# Patient Record
Sex: Female | Born: 1950 | Race: Black or African American | Marital: Married | Smoking: Never smoker
Health system: Southern US, Community
[De-identification: ages and names within clinical notes are randomized; demographics above are authoritative.]

## PROBLEM LIST (undated history)

## (undated) DIAGNOSIS — E785 Hyperlipidemia, unspecified: Secondary | ICD-10-CM

## (undated) HISTORY — DX: Hyperlipidemia, unspecified: E78.5

## (undated) HISTORY — PX: ABDOMINAL HYSTERECTOMY: SHX81

---

## 1998-12-07 ENCOUNTER — Other Ambulatory Visit: Admission: RE | Admit: 1998-12-07 | Discharge: 1998-12-07 | Payer: Self-pay | Admitting: Internal Medicine

## 1999-12-13 ENCOUNTER — Other Ambulatory Visit: Admission: RE | Admit: 1999-12-13 | Discharge: 1999-12-13 | Payer: Self-pay | Admitting: Internal Medicine

## 2001-01-22 ENCOUNTER — Other Ambulatory Visit: Admission: RE | Admit: 2001-01-22 | Discharge: 2001-01-22 | Payer: Self-pay | Admitting: Internal Medicine

## 2001-03-05 ENCOUNTER — Ambulatory Visit (HOSPITAL_COMMUNITY): Admission: RE | Admit: 2001-03-05 | Discharge: 2001-03-05 | Payer: Self-pay | Admitting: Internal Medicine

## 2001-03-05 ENCOUNTER — Encounter: Payer: Self-pay | Admitting: Internal Medicine

## 2002-07-15 ENCOUNTER — Ambulatory Visit (HOSPITAL_COMMUNITY): Admission: RE | Admit: 2002-07-15 | Discharge: 2002-07-15 | Payer: Self-pay | Admitting: Gastroenterology

## 2015-07-29 DIAGNOSIS — R7309 Other abnormal glucose: Secondary | ICD-10-CM | POA: Diagnosis not present

## 2015-07-29 DIAGNOSIS — E669 Obesity, unspecified: Secondary | ICD-10-CM | POA: Diagnosis not present

## 2015-07-29 DIAGNOSIS — E785 Hyperlipidemia, unspecified: Secondary | ICD-10-CM | POA: Diagnosis not present

## 2015-07-29 DIAGNOSIS — Z0000 Encounter for general adult medical examination without abnormal findings: Secondary | ICD-10-CM | POA: Diagnosis not present

## 2015-09-30 DIAGNOSIS — Z1231 Encounter for screening mammogram for malignant neoplasm of breast: Secondary | ICD-10-CM | POA: Diagnosis not present

## 2015-09-30 DIAGNOSIS — Z803 Family history of malignant neoplasm of breast: Secondary | ICD-10-CM | POA: Diagnosis not present

## 2016-07-31 DIAGNOSIS — E669 Obesity, unspecified: Secondary | ICD-10-CM | POA: Diagnosis not present

## 2016-07-31 DIAGNOSIS — Z6835 Body mass index (BMI) 35.0-35.9, adult: Secondary | ICD-10-CM | POA: Diagnosis not present

## 2016-07-31 DIAGNOSIS — Z1231 Encounter for screening mammogram for malignant neoplasm of breast: Secondary | ICD-10-CM | POA: Diagnosis not present

## 2016-07-31 DIAGNOSIS — E785 Hyperlipidemia, unspecified: Secondary | ICD-10-CM | POA: Diagnosis not present

## 2016-11-13 DIAGNOSIS — Z1231 Encounter for screening mammogram for malignant neoplasm of breast: Secondary | ICD-10-CM | POA: Diagnosis not present

## 2016-11-13 DIAGNOSIS — Z803 Family history of malignant neoplasm of breast: Secondary | ICD-10-CM | POA: Diagnosis not present

## 2017-07-31 DIAGNOSIS — Z23 Encounter for immunization: Secondary | ICD-10-CM | POA: Diagnosis not present

## 2017-07-31 DIAGNOSIS — R7309 Other abnormal glucose: Secondary | ICD-10-CM | POA: Diagnosis not present

## 2017-07-31 DIAGNOSIS — E785 Hyperlipidemia, unspecified: Secondary | ICD-10-CM | POA: Diagnosis not present

## 2017-08-02 DIAGNOSIS — Z0000 Encounter for general adult medical examination without abnormal findings: Secondary | ICD-10-CM | POA: Diagnosis not present

## 2017-08-02 DIAGNOSIS — E2839 Other primary ovarian failure: Secondary | ICD-10-CM | POA: Diagnosis not present

## 2017-08-02 DIAGNOSIS — E669 Obesity, unspecified: Secondary | ICD-10-CM | POA: Diagnosis not present

## 2017-08-02 DIAGNOSIS — Z1231 Encounter for screening mammogram for malignant neoplasm of breast: Secondary | ICD-10-CM | POA: Diagnosis not present

## 2017-08-21 DIAGNOSIS — N390 Urinary tract infection, site not specified: Secondary | ICD-10-CM | POA: Diagnosis not present

## 2017-08-21 DIAGNOSIS — B354 Tinea corporis: Secondary | ICD-10-CM | POA: Diagnosis not present

## 2017-08-21 DIAGNOSIS — R350 Frequency of micturition: Secondary | ICD-10-CM | POA: Diagnosis not present

## 2017-10-15 ENCOUNTER — Other Ambulatory Visit: Payer: Self-pay | Admitting: Nurse Practitioner

## 2017-10-15 ENCOUNTER — Other Ambulatory Visit: Payer: Self-pay | Admitting: Internal Medicine

## 2017-10-15 DIAGNOSIS — E2839 Other primary ovarian failure: Secondary | ICD-10-CM

## 2017-12-11 ENCOUNTER — Other Ambulatory Visit: Payer: Self-pay

## 2018-01-08 ENCOUNTER — Ambulatory Visit
Admission: RE | Admit: 2018-01-08 | Discharge: 2018-01-08 | Disposition: A | Discharge status: Home / Self Care | Payer: Medicare Other | Source: Ambulatory Visit | Attending: Internal Medicine | Admitting: Internal Medicine

## 2018-01-08 DIAGNOSIS — Z780 Asymptomatic menopausal state: Secondary | ICD-10-CM | POA: Diagnosis not present

## 2018-01-08 DIAGNOSIS — Z803 Family history of malignant neoplasm of breast: Secondary | ICD-10-CM | POA: Diagnosis not present

## 2018-01-08 DIAGNOSIS — Z1231 Encounter for screening mammogram for malignant neoplasm of breast: Secondary | ICD-10-CM | POA: Diagnosis not present

## 2018-01-08 DIAGNOSIS — E2839 Other primary ovarian failure: Secondary | ICD-10-CM

## 2018-01-08 DIAGNOSIS — Z13820 Encounter for screening for osteoporosis: Secondary | ICD-10-CM | POA: Diagnosis not present

## 2018-01-10 ENCOUNTER — Other Ambulatory Visit: Payer: Self-pay

## 2018-06-19 DIAGNOSIS — M1711 Unilateral primary osteoarthritis, right knee: Secondary | ICD-10-CM | POA: Diagnosis not present

## 2018-06-19 DIAGNOSIS — M25561 Pain in right knee: Secondary | ICD-10-CM | POA: Diagnosis not present

## 2018-07-11 DIAGNOSIS — M67961 Unspecified disorder of synovium and tendon, right lower leg: Secondary | ICD-10-CM | POA: Diagnosis not present

## 2018-07-11 DIAGNOSIS — M25572 Pain in left ankle and joints of left foot: Secondary | ICD-10-CM | POA: Diagnosis not present

## 2018-07-11 DIAGNOSIS — M67962 Unspecified disorder of synovium and tendon, left lower leg: Secondary | ICD-10-CM | POA: Diagnosis not present

## 2018-07-11 DIAGNOSIS — M79671 Pain in right foot: Secondary | ICD-10-CM | POA: Diagnosis not present

## 2018-07-11 DIAGNOSIS — M25571 Pain in right ankle and joints of right foot: Secondary | ICD-10-CM | POA: Diagnosis not present

## 2018-07-11 DIAGNOSIS — M79672 Pain in left foot: Secondary | ICD-10-CM | POA: Diagnosis not present

## 2018-07-23 DIAGNOSIS — M79671 Pain in right foot: Secondary | ICD-10-CM | POA: Diagnosis not present

## 2018-07-23 DIAGNOSIS — M79672 Pain in left foot: Secondary | ICD-10-CM | POA: Diagnosis not present

## 2018-07-25 DIAGNOSIS — M79672 Pain in left foot: Secondary | ICD-10-CM | POA: Diagnosis not present

## 2018-07-25 DIAGNOSIS — M79671 Pain in right foot: Secondary | ICD-10-CM | POA: Diagnosis not present

## 2018-07-30 DIAGNOSIS — M79671 Pain in right foot: Secondary | ICD-10-CM | POA: Diagnosis not present

## 2018-07-30 DIAGNOSIS — M79672 Pain in left foot: Secondary | ICD-10-CM | POA: Diagnosis not present

## 2018-08-01 DIAGNOSIS — M79672 Pain in left foot: Secondary | ICD-10-CM | POA: Diagnosis not present

## 2018-08-01 DIAGNOSIS — M79671 Pain in right foot: Secondary | ICD-10-CM | POA: Diagnosis not present

## 2018-08-02 DIAGNOSIS — M1711 Unilateral primary osteoarthritis, right knee: Secondary | ICD-10-CM | POA: Diagnosis not present

## 2018-08-05 DIAGNOSIS — M79672 Pain in left foot: Secondary | ICD-10-CM | POA: Diagnosis not present

## 2018-08-05 DIAGNOSIS — M79671 Pain in right foot: Secondary | ICD-10-CM | POA: Diagnosis not present

## 2018-08-06 DIAGNOSIS — M79672 Pain in left foot: Secondary | ICD-10-CM | POA: Diagnosis not present

## 2018-08-06 DIAGNOSIS — M79671 Pain in right foot: Secondary | ICD-10-CM | POA: Diagnosis not present

## 2018-08-08 ENCOUNTER — Ambulatory Visit: Payer: Self-pay | Admitting: Internal Medicine

## 2018-08-08 ENCOUNTER — Ambulatory Visit (INDEPENDENT_AMBULATORY_CARE_PROVIDER_SITE_OTHER): Payer: Medicare Other

## 2018-08-08 VITALS — BP 148/78 | HR 78 | Temp 98.2°F | Ht 62.2 in | Wt 200.8 lb

## 2018-08-08 DIAGNOSIS — Z0000 Encounter for general adult medical examination without abnormal findings: Secondary | ICD-10-CM

## 2018-08-08 NOTE — Progress Notes (Signed)
Subjective:   Kathryn Palmer is a 68 y.o. female who presents for Medicare Annual (Subsequent) preventive examination.  Review of Systems:  n/a Cardiac Risk Factors include: dyslipidemia     Objective:     Vitals: BP (!) 148/78 (BP Location: Left Arm, Patient Position: Sitting)   Pulse 78   Temp 98.2 F (36.8 C) (Oral)   Ht 5' 2.2" (1.58 m)   Wt 200 lb 12.8 oz (91.1 kg)   SpO2 98%   BMI 36.49 kg/m   Body mass index is 36.49 kg/m.  Advanced Directives 08/08/2018  Does Patient Have a Medical Advance Directive? Yes  Type of Estate agent of [place];Living will  Does patient want to make changes to medical advance directive? No - Patient declined  Copy of Healthcare Power of Attorney in Chart? No - copy requested    Tobacco Social History   Tobacco Use  Smoking Status Never Smoker  Smokeless Tobacco Never Used     Counseling given: Not Answered   Clinical Intake:  Pre-visit preparation completed: Yes  Pain : No/denies pain Pain Score: 0-No pain     Nutritional Status: BMI > 30  Obese Nutritional Risks: None Diabetes: No  How often do you need to have someone help you when you read instructions, pamphlets, or other written materials from your doctor or pharmacy?: 1 - Never What is the last grade level you completed in school?: some college  Interpreter Needed?: No  Information entered by :: NAllen LPN  Past Medical History:  Diagnosis Date  . Hyperlipidemia    Past Surgical History:  Procedure Laterality Date  . ABDOMINAL HYSTERECTOMY  1980s   partial   Family History  Problem Relation Age of Onset  . Cancer Mother   . Cancer Maternal Grandmother    Social History   Socioeconomic History  . Marital status: Married    Spouse name: Not on file  . Number of children: Not on file  . Years of education: Not on file  . Highest education level: Not on file  Occupational History  . Not on file  Social Needs  . Financial  resource strain: Not hard at all  . Food insecurity:    Worry: Never true    Inability: Never true  . Transportation needs:    Medical: No    Non-medical: No  Tobacco Use  . Smoking status: Never Smoker  . Smokeless tobacco: Never Used  Substance and Sexual Activity  . Alcohol use: Not Currently  . Drug use: Not Currently  . Sexual activity: Not Currently  Lifestyle  . Physical activity:    Days per week: 3 days    Minutes per session: 60 min  . Stress: Not at all  Relationships  . Social connections:    Talks on phone: Not on file    Gets together: Not on file    Attends religious service: Not on file    Active member of club or organization: Not on file    Attends meetings of clubs or organizations: Not on file    Relationship status: Not on file  Other Topics Concern  . Not on file  Social History Narrative  . Not on file    Outpatient Encounter Medications as of 08/08/2018  Medication Sig  . Cholecalciferol (VITAMIN D) 125 MCG (5000 UT) CAPS Take 2,000 Units by mouth daily.   No facility-administered encounter medications on file as of 08/08/2018.     Activities of Daily  Living In your present state of health, do you have any difficulty performing the following activities: 08/08/2018  Hearing? N  Vision? N  Difficulty concentrating or making decisions? N  Walking or climbing stairs? Y  Comment joint pain, but is being treated  Dressing or bathing? N  Doing errands, shopping? N  Preparing Food and eating ? N  Using the Toilet? N  In the past six months, have you accidently leaked urine? N  Do you have problems with loss of bowel control? N  Managing your Medications? N  Managing your Finances? N  Housekeeping or managing your Housekeeping? N  Some recent data might be hidden    Patient Care Team: Arnette Felts, FNP as PCP - General (General Practice)    Assessment:   This is a routine wellness examination for Kathryn Palmer.  Exercise Activities and Dietary  recommendations Current Exercise Habits: Structured exercise class, Type of exercise: calisthenics;walking, Time (Minutes): 60, Frequency (Times/Week): 3, Weekly Exercise (Minutes/Week): 180, Intensity: Moderate, Exercise limited by: None identified  Goals    . Weight (lb) < 200 lb (90.7 kg) (pt-stated)     Would like to get to 180 pounds. Like to eat healthier       Fall Risk Fall Risk  08/08/2018 01/10/2018  Falls in the past year? 0 No  Comment - Emmi Telephone Survey: data to providers prior to load  Follow up Falls prevention discussed;Education provided -   Is the patient's home free of loose throw rugs in walkways, pet beds, electrical cords, etc?   yes      Grab bars in the bathroom? no      Handrails on the stairs?  n/a      Adequate lighting?   yes  Timed Get Up and Go performed: n/a  Depression Screen PHQ 2/9 Scores 08/08/2018  PHQ - 2 Score 0     Cognitive Function     6CIT Screen 08/08/2018  What Year? 0 points  What month? 0 points  What time? 0 points  Count back from 20 0 points  Months in reverse 0 points  Repeat phrase 0 points  Total Score 0     There is no immunization history on file for this patient.  Qualifies for Shingles Vaccine? yes  Screening Tests Health Maintenance  Topic Date Due  . Hepatitis C Screening  12/20/1950  . MAMMOGRAM  02/04/2000  . INFLUENZA VACCINE  09/10/2018 (Originally 01/10/2018)  . PNA vac Low Risk Adult (1 of 2 - PCV13) 08/09/2019 (Originally 02/04/2015)  . COLONOSCOPY  09/10/2022  . TETANUS/TDAP  07/22/2024  . DEXA SCAN  Completed    Cancer Screenings: Lung: Low Dose CT Chest recommended if Age 51-80 years, 30 pack-year currently smoking OR have quit w/in 15years. Patient does not qualify. Breast:  Up to date on Mammogram? No   Up to date of Bone Density/Dexa? Yes Colorectal: up to date  Additional Screenings: : Hepatitis C Screening: due     Plan:    Wants to start eating healthier and get in to 180  pounds.   I have personally reviewed and noted the following in the patient's chart:   . Medical and social history . Use of alcohol, tobacco or illicit drugs  . Current medications and supplements . Functional ability and status . Nutritional status . Physical activity . Advanced directives . List of other physicians . Hospitalizations, surgeries, and ER visits in previous 12 months . Vitals . Screenings to include cognitive, depression, and  falls . Referrals and appointments  In addition, I have reviewed and discussed with patient certain preventive protocols, quality metrics, and best practice recommendations. A written personalized care plan for preventive services as well as general preventive health recommendations were provided to patient.     Barb Merino, LPN  01/20/9146

## 2018-08-08 NOTE — Patient Instructions (Signed)
Kathryn Palmer , Thank you for taking time to come for your Medicare Wellness Visit. I appreciate your ongoing commitment to your health goals. Please review the following plan we discussed and let me know if I can assist you in the future.   Screening recommendations/referrals: Colonoscopy: 08/2012 Mammogram: scheduling Bone Density: 12/2017 Recommended yearly ophthalmology/optometry visit for glaucoma screening and checkup Recommended yearly dental visit for hygiene and checkup  Vaccinations: Influenza vaccine: declines Pneumococcal vaccine: declines Tdap vaccine: 07/2014 Shingles vaccine: declines    Advanced directives: Please bring a copy of your POA (Power of Attorney) and/or Living Will to your next appointment.    Conditions/risks identified: Obesity  Next appointment: 08/13/2019 at 10:30   Preventive Care 65 Years and Older, Female Preventive care refers to lifestyle choices and visits with your health care provider that can promote health and wellness. What does preventive care include?  A yearly physical exam. This is also called an annual well check.  Dental exams once or twice a year.  Routine eye exams. Ask your health care provider how often you should have your eyes checked.  Personal lifestyle choices, including:  Daily care of your teeth and gums.  Regular physical activity.  Eating a healthy diet.  Avoiding tobacco and drug use.  Limiting alcohol use.  Practicing safe sex.  Taking low-dose aspirin every day.  Taking vitamin and mineral supplements as recommended by your health care provider. What happens during an annual well check? The services and screenings done by your health care provider during your annual well check will depend on your age, overall health, lifestyle risk factors, and family history of disease. Counseling  Your health care provider may ask you questions about your:  Alcohol use.  Tobacco use.  Drug use.  Emotional  well-being.  Home and relationship well-being.  Sexual activity.  Eating habits.  History of falls.  Memory and ability to understand (cognition).  Work and work Astronomer.  Reproductive health. Screening  You may have the following tests or measurements:  Height, weight, and BMI.  Blood pressure.  Lipid and cholesterol levels. These may be checked every 5 years, or more frequently if you are over 53 years old.  Skin check.  Lung cancer screening. You may have this screening every year starting at age 39 if you have a 30-pack-year history of smoking and currently smoke or have quit within the past 15 years.  Fecal occult blood test (FOBT) of the stool. You may have this test every year starting at age 44.  Flexible sigmoidoscopy or colonoscopy. You may have a sigmoidoscopy every 5 years or a colonoscopy every 10 years starting at age 52.  Hepatitis C blood test.  Hepatitis B blood test.  Sexually transmitted disease (STD) testing.  Diabetes screening. This is done by checking your blood sugar (glucose) after you have not eaten for a while (fasting). You may have this done every 1-3 years.  Bone density scan. This is done to screen for osteoporosis. You may have this done starting at age 75.  Mammogram. This may be done every 1-2 years. Talk to your health care provider about how often you should have regular mammograms. Talk with your health care provider about your test results, treatment options, and if necessary, the need for more tests. Vaccines  Your health care provider may recommend certain vaccines, such as:  Influenza vaccine. This is recommended every year.  Tetanus, diphtheria, and acellular pertussis (Tdap, Td) vaccine. You may need a Td booster  every 10 years.  Zoster vaccine. You may need this after age 80.  Pneumococcal 13-valent conjugate (PCV13) vaccine. One dose is recommended after age 88.  Pneumococcal polysaccharide (PPSV23) vaccine. One  dose is recommended after age 62. Talk to your health care provider about which screenings and vaccines you need and how often you need them. This information is not intended to replace advice given to you by your health care provider. Make sure you discuss any questions you have with your health care provider. Document Released: 06/25/2015 Document Revised: 02/16/2016 Document Reviewed: 03/30/2015 Elsevier Interactive Patient Education  2017 [place] Prevention in the Home Falls can cause injuries. They can happen to people of all ages. There are many things you can do to make your home safe and to help prevent falls. What can I do on the outside of my home?  Regularly fix the edges of walkways and driveways and fix any cracks.  Remove anything that might make you trip as you walk through a door, such as a raised step or threshold.  Trim any bushes or trees on the path to your home.  Use bright outdoor lighting.  Clear any walking paths of anything that might make someone trip, such as rocks or tools.  Regularly check to see if handrails are loose or broken. Make sure that both sides of any steps have handrails.  Any raised decks and porches should have guardrails on the edges.  Have any leaves, snow, or ice cleared regularly.  Use sand or salt on walking paths during winter.  Clean up any spills in your garage right away. This includes oil or grease spills. What can I do in the bathroom?  Use night lights.  Install grab bars by the toilet and in the tub and shower. Do not use towel bars as grab bars.  Use non-skid mats or decals in the tub or shower.  If you need to sit down in the shower, use a plastic, non-slip stool.  Keep the floor dry. Clean up any water that spills on the floor as soon as it happens.  Remove soap buildup in the tub or shower regularly.  Attach bath mats securely with double-sided non-slip rug tape.  Do not have throw rugs and other  things on the floor that can make you trip. What can I do in the bedroom?  Use night lights.  Make sure that you have a light by your bed that is easy to reach.  Do not use any sheets or blankets that are too big for your bed. They should not hang down onto the floor.  Have a firm chair that has side arms. You can use this for support while you get dressed.  Do not have throw rugs and other things on the floor that can make you trip. What can I do in the kitchen?  Clean up any spills right away.  Avoid walking on wet floors.  Keep items that you use a lot in easy-to-reach places.  If you need to reach something above you, use a strong step stool that has a grab bar.  Keep electrical cords out of the way.  Do not use floor polish or wax that makes floors slippery. If you must use wax, use non-skid floor wax.  Do not have throw rugs and other things on the floor that can make you trip. What can I do with my stairs?  Do not leave any items on the stairs.  Make  sure that there are handrails on both sides of the stairs and use them. Fix handrails that are broken or loose. Make sure that handrails are as long as the stairways.  Check any carpeting to make sure that it is firmly attached to the stairs. Fix any carpet that is loose or worn.  Avoid having throw rugs at the top or bottom of the stairs. If you do have throw rugs, attach them to the floor with carpet tape.  Make sure that you have a light switch at the top of the stairs and the bottom of the stairs. If you do not have them, ask someone to add them for you. What else can I do to help prevent falls?  Wear shoes that:  Do not have high heels.  Have rubber bottoms.  Are comfortable and fit you well.  Are closed at the toe. Do not wear sandals.  If you use a stepladder:  Make sure that it is fully opened. Do not climb a closed stepladder.  Make sure that both sides of the stepladder are locked into place.  Ask  someone to hold it for you, if possible.  Clearly mark and make sure that you can see:  Any grab bars or handrails.  First and last steps.  Where the edge of each step is.  Use tools that help you move around (mobility aids) if they are needed. These include:  Canes.  Walkers.  Scooters.  Crutches.  Turn on the lights when you go into a dark area. Replace any light bulbs as soon as they burn out.  Set up your furniture so you have a clear path. Avoid moving your furniture around.  If any of your floors are uneven, fix them.  If there are any pets around you, be aware of where they are.  Review your medicines with your doctor. Some medicines can make you feel dizzy. This can increase your chance of falling. Ask your doctor what other things that you can do to help prevent falls. This information is not intended to replace advice given to you by your health care provider. Make sure you discuss any questions you have with your health care provider. Document Released: 03/25/2009 Document Revised: 11/04/2015 Document Reviewed: 07/03/2014 Elsevier Interactive Patient Education  2017 [employer].

## 2018-08-15 DIAGNOSIS — Z23 Encounter for immunization: Secondary | ICD-10-CM | POA: Diagnosis not present

## 2018-09-11 ENCOUNTER — Encounter (HOSPITAL_COMMUNITY): Payer: Self-pay | Admitting: Emergency Medicine

## 2018-09-11 ENCOUNTER — Other Ambulatory Visit: Payer: Self-pay

## 2018-09-11 ENCOUNTER — Emergency Department (HOSPITAL_COMMUNITY): Payer: Medicare Other

## 2018-09-11 ENCOUNTER — Emergency Department (HOSPITAL_COMMUNITY)
Admission: EM | Admit: 2018-09-11 | Discharge: 2018-09-11 | Disposition: A | Discharge status: Home / Self Care | Payer: Medicare Other | Attending: Emergency Medicine | Admitting: Emergency Medicine

## 2018-09-11 DIAGNOSIS — R079 Chest pain, unspecified: Secondary | ICD-10-CM | POA: Insufficient documentation

## 2018-09-11 DIAGNOSIS — R61 Generalized hyperhidrosis: Secondary | ICD-10-CM | POA: Insufficient documentation

## 2018-09-11 DIAGNOSIS — R0789 Other chest pain: Secondary | ICD-10-CM | POA: Diagnosis not present

## 2018-09-11 LAB — CBC
HCT: 43.3 % (ref 36.0–46.0)
Hemoglobin: 13.7 g/dL (ref 12.0–15.0)
MCH: 30 pg (ref 26.0–34.0)
MCHC: 31.6 g/dL (ref 30.0–36.0)
MCV: 95 fL (ref 80.0–100.0)
Platelets: 185 10*3/uL (ref 150–400)
RBC: 4.56 MIL/uL (ref 3.87–5.11)
RDW: 12.4 % (ref 11.5–15.5)
WBC: 7.3 10*3/uL (ref 4.0–10.5)
nRBC: 0 % (ref 0.0–0.2)

## 2018-09-11 LAB — BASIC METABOLIC PANEL
Anion gap: 10 (ref 5–15)
BUN: 11 mg/dL (ref 8–23)
CO2: 24 mmol/L (ref 22–32)
Calcium: 9.2 mg/dL (ref 8.9–10.3)
Chloride: 103 mmol/L (ref 98–111)
Creatinine, Ser: 0.9 mg/dL (ref 0.44–1.00)
GFR calc Af Amer: >60 mL/min (ref >60)
GFR calc non Af Amer: >60 mL/min (ref >60)
Glucose, Bld: 114 mg/dL — ABNORMAL HIGH (ref 70–99)
Potassium: 3.9 mmol/L (ref 3.5–5.1)
Sodium: 137 mmol/L (ref 135–145)

## 2018-09-11 LAB — TROPONIN I
Troponin I: <0.03 ng/mL (ref <0.03)
Troponin I: <0.03 ng/mL (ref <0.03)

## 2018-09-11 IMAGING — DX PORTABLE CHEST - 1 VIEW
1 series · 1 of 1 positions shown · non-contrast
Comparison: None.

CLINICAL DATA: Left-sided chest pain

EXAM:
PORTABLE CHEST 1 VIEW

[chest ap]
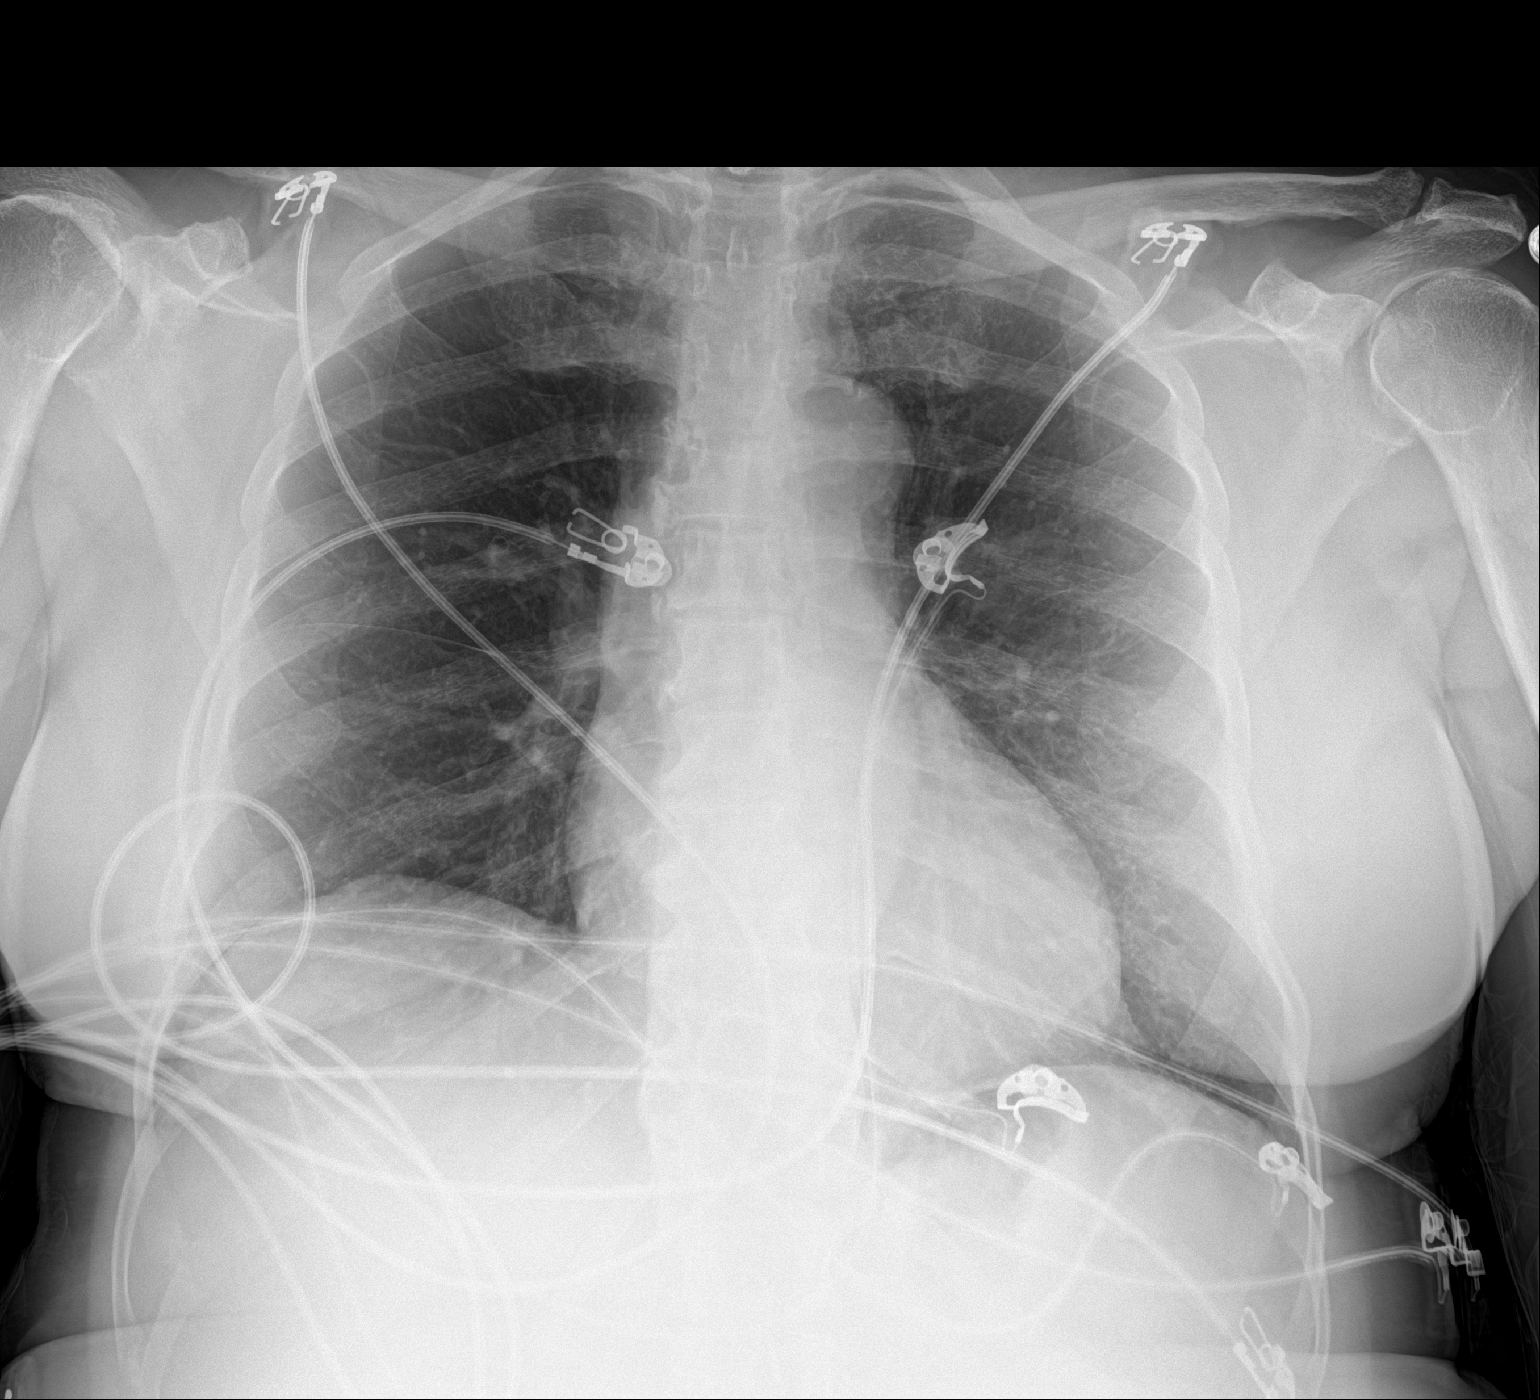

[1 of 1 positions shown; findings below may reference images not displayed]

FINDINGS: The heart size and mediastinal contours are within normal limits.
Both lungs are clear. The visualized skeletal structures are
unremarkable.
IMPRESSION: No acute abnormality noted.

## 2018-09-11 NOTE — ED Notes (Signed)
Pt verbalized understanding of discharge paperwork and follow-up care.  °

## 2018-09-11 NOTE — Discharge Instructions (Signed)
Follow-up with your primary care doctor or a cardiologist for further evaluation.  Return to the emergency room as needed for worsening symptoms

## 2018-09-11 NOTE — ED Provider Notes (Signed)
[facility] EMERGENCY DEPARTMENT Provider Note   CSN: [id number] Arrival date & time: 09/11/18  1207    History   Chief Complaint Chief Complaint  Patient presents with  . Chest Pain    HPI Kathryn Palmer is a 68 y.o. female.  HPI: A 68 year old patient presents for evaluation of chest pain. Initial onset of pain was less than one hour ago. The patient's chest pain is sharp and is not worse with exertion. The patient reports some diaphoresis. The patient's chest pain is middle- or left-sided, is not well-localized, is not described as heaviness/pressure/tightness and does not radiate to the arms/jaw/neck. The patient does not complain of nausea. The patient has no history of stroke, has no history of peripheral artery disease, has not smoked in the past 90 days, denies any history of treated diabetes, has no relevant family history of coronary artery disease (first degree relative at less than age 45), is not hypertensive, has no history of hypercholesterolemia and does not have an elevated BMI (>=30).    Patient states she had sharp pain in her chest associated with diaphoresis.  Initially the pain was more severe at about 8 out of 10 but it has now subsequently resolved.  The duration was approximately 3 minutes.  Patient does not have a history of heart disease.  Patient has listed a past medical history of hyperlipidemia however she is not on any medications. HPI  Past Medical History:  Diagnosis Date  . Hyperlipidemia     There are no active problems to display for this patient.   Past Surgical History:  Procedure Laterality Date  . ABDOMINAL HYSTERECTOMY  1980s   partial     OB History   No obstetric history on file.      Home Medications    Prior to Admission medications   Medication Sig Start Date End Date Taking? Authorizing Provider  Cholecalciferol (VITAMIN D) 125 MCG (5000 UT) CAPS Take 2,000 Units by mouth daily.    [provider]    Family History Family History  Problem Relation Age of Onset  . Cancer Mother   . Cancer Maternal Grandmother     Social History Social History   Tobacco Use  . Smoking status: Never Smoker  . Smokeless tobacco: Never Used  Substance Use Topics  . Alcohol use: Not Currently  . Drug use: Not Currently     Allergies   Aspirin   Review of Systems Review of Systems  All other systems reviewed and are negative.    Physical Exam Updated Vital Signs BP 123/78   Pulse 74   Temp 98 F (36.7 C)   Resp 16   Ht 1.575 m ( )   Wt 90.7 kg   LMP  (LMP Unknown)   SpO2 98%   BMI 36.58 kg/m   Physical Exam Vitals signs and nursing note reviewed.  Constitutional:      General: She is not in acute distress.    Appearance: She is well-developed.  HENT:     Head: Normocephalic and atraumatic.     Right Ear: External ear normal.     Left Ear: External ear normal.  Eyes:     General: No scleral icterus.       Right eye: No discharge.        Left eye: No discharge.     Conjunctiva/sclera: Conjunctivae normal.  Neck:     Musculoskeletal: Neck supple.     Trachea: No  tracheal deviation.  Cardiovascular:     Rate and Rhythm: Normal rate and regular rhythm.  Pulmonary:     Effort: Pulmonary effort is normal. No respiratory distress.     Breath sounds: Normal breath sounds. No stridor. No wheezing or rales.  Abdominal:     General: Bowel sounds are normal. There is no distension.     Palpations: Abdomen is soft.     Tenderness: There is no abdominal tenderness. There is no guarding or rebound.  Musculoskeletal:        General: No tenderness.  Skin:    General: Skin is warm and dry.     Findings: No rash.  Neurological:     Mental Status: She is alert.     Cranial Nerves: No cranial nerve deficit (no facial droop, extraocular movements intact, no slurred speech).     Sensory: No sensory deficit.     Motor: No abnormal muscle tone or seizure activity.      Coordination: Coordination normal.      ED Treatments / Results  Labs (all labs ordered are listed, but only abnormal results are displayed) Labs Reviewed  BASIC METABOLIC PANEL - Abnormal; Notable for the following components:      Result Value   Glucose, Bld 114 (*)    All other components within normal limits  CBC  TROPONIN I  TROPONIN I    EKG EKG Interpretation  Date/Time:  Wednesday September 11 2018 12:14:56 EDT Ventricular Rate:  84 PR Interval:    QRS Duration: 93 QT Interval:  412 QTC Calculation: 490 R Axis:   26 Text Interpretation:  Sinus rhythm Borderline repolarization abnormality Borderline prolonged QT interval No old tracing to compare Confirmed by Linwood Dibbles [id number]) on 09/11/2018 12:28:50 PM   Radiology Dg Chest Portable 1 View  Result Date: 09/11/2018 CLINICAL DATA:  Left-sided chest pain EXAM: PORTABLE CHEST 1 VIEW COMPARISON:  None. FINDINGS: The heart size and mediastinal contours are within normal limits. Both lungs are clear. The visualized skeletal structures are unremarkable. IMPRESSION: No acute abnormality noted. Electronically Signed   By: Alcide Clever M.D.   On: 09/11/2018 12:46    Procedures Procedures (including critical care time)  Medications Ordered in ED Medications - No data to display   Initial Impression / Assessment and Plan / ED Course  I have reviewed the triage vital signs and the nursing notes.  Pertinent labs & imaging results that were available during my care of the patient were reviewed by me and considered in my medical decision making (see chart for details).  Clinical Course as of Sep 10 1624  Wed Sep 11, 2018  1535 Patient's initial laboratory tests are reassuring.   [JK]  1535 Chest x-ray negative for acute process.   [JK]    Clinical Course User Index [JK] Linwood Dibbles, MD    HEAR Score: 3  Patient presented to the emergency room for evaluation of atypical chest pain.  Patient is overall low risk category.   Fortunately her ED work-up is reassuring.  I have a low suspicion for acute coronary syndrome, pulmonary embolism, aortic dissection or other emergent etiology.  Patient has remained comfortable in the ED.  It is reasonable for her to be discharged and we discussed outpatient follow-up with cardiology.  Would avoid hospitalization considering the current pandemic.  Warning signs precautions discussed.  Kathryn Palmer was evaluated in Emergency Department on 09/11/2018 for the symptoms described in the history of present illness. She was evaluated  in the context of the global COVID-19 pandemic, which necessitated consideration that the patient might be at risk for infection with the SARS-CoV-2 virus that causes COVID-19. Institutional protocols and algorithms that pertain to the evaluation of patients at risk for COVID-19 are in a state of rapid change based on information released by regulatory bodies including the CDC and federal and state organizations. These policies and algorithms were followed during the patient's care in the ED.   Final Clinical Impressions(s) / ED Diagnoses   Final diagnoses:  Chest pain, unspecified type    ED Discharge Orders    None       Linwood Dibbles, MD 09/11/18 1627

## 2018-09-11 NOTE — ED Notes (Signed)
Pt ambulated to restroom with steady gait Tolerated well 

## 2018-09-11 NOTE — ED Triage Notes (Signed)
Pt arrives POV with complaints of chest pain. While she was at home her pain was 8/10. Pt denies pain at this time

## 2018-09-12 DIAGNOSIS — R079 Chest pain, unspecified: Secondary | ICD-10-CM | POA: Diagnosis not present

## 2018-09-12 DIAGNOSIS — R110 Nausea: Secondary | ICD-10-CM | POA: Diagnosis not present

## 2018-11-08 DIAGNOSIS — M1712 Unilateral primary osteoarthritis, left knee: Secondary | ICD-10-CM | POA: Diagnosis not present

## 2018-11-08 DIAGNOSIS — M170 Bilateral primary osteoarthritis of knee: Secondary | ICD-10-CM | POA: Diagnosis not present

## 2018-11-08 DIAGNOSIS — M1711 Unilateral primary osteoarthritis, right knee: Secondary | ICD-10-CM | POA: Diagnosis not present

## 2018-12-23 DIAGNOSIS — Z79899 Other long term (current) drug therapy: Secondary | ICD-10-CM | POA: Diagnosis not present

## 2018-12-23 DIAGNOSIS — E559 Vitamin D deficiency, unspecified: Secondary | ICD-10-CM | POA: Diagnosis not present

## 2019-02-03 DIAGNOSIS — Z803 Family history of malignant neoplasm of breast: Secondary | ICD-10-CM | POA: Diagnosis not present

## 2019-02-03 DIAGNOSIS — Z1231 Encounter for screening mammogram for malignant neoplasm of breast: Secondary | ICD-10-CM | POA: Diagnosis not present

## 2019-02-26 ENCOUNTER — Telehealth: Payer: Self-pay | Admitting: Nurse Practitioner

## 2019-02-26 NOTE — Chronic Care Management (AMB) (Signed)
°  Chronic Care Management   Outreach Note  02/26/2019 Name: Kathryn Palmer MRN: [id number] DOB: 1950-07-30  Referred by: Minette Brine, FNP Reason for referral : No chief complaint on file.   Third unsuccessful telephone outreach was attempted today. The patient was referred to the case management team for assistance with chronic care management and care coordination. The patient's primary care provider has been notified of our unsuccessful attempts to make or maintain contact with the patient. The care management team is pleased to engage with this patient at any time in the future should he/she be interested in assistance from the care management team.   Follow Up Plan: The care management team is available to follow up with the patient after provider conversation with the patient regarding recommendation for care management engagement and subsequent re-referral to the care management team.   [place]  [email or web address]   ??[phone]

## 2019-05-22 DIAGNOSIS — M170 Bilateral primary osteoarthritis of knee: Secondary | ICD-10-CM | POA: Diagnosis not present

## 2019-07-10 ENCOUNTER — Ambulatory Visit: Payer: Medicare Other

## 2019-07-18 ENCOUNTER — Ambulatory Visit: Payer: Medicare Other | Attending: Internal Medicine

## 2019-07-18 DIAGNOSIS — Z23 Encounter for immunization: Secondary | ICD-10-CM | POA: Insufficient documentation

## 2019-07-18 NOTE — Progress Notes (Signed)
   Covid-19 Vaccination Clinic  Name:  AUJANAE MCCULLUM    MRN: [id number] DOB: Mar 10, 1951  07/18/2019  Ms. Millett was observed post Covid-19 immunization for 15 minutes without incidence. She was provided with Vaccine Information Sheet and instruction to access the V-Safe system.   Ms. Schuelke was instructed to call 911 with any severe reactions post vaccine: Marland Kitchen Difficulty breathing  . Swelling of your face and throat  . A fast heartbeat  . A bad rash all over your body  . Dizziness and weakness    Immunizations Administered    Name Date Dose VIS Date Route   Pfizer COVID-19 Vaccine 07/18/2019  4:50 PM 0.3 mL 05/23/2019 Intramuscular   Manufacturer: [employer], [employer]   Lot: [id number]   NDC: [address]-4

## 2019-07-21 ENCOUNTER — Ambulatory Visit: Payer: Medicare Other

## 2019-07-29 ENCOUNTER — Telehealth: Payer: Self-pay | Admitting: General Practice

## 2019-07-29 NOTE — Telephone Encounter (Signed)
I spoke with the patient, and she said that she now goes to [facility] for her primary care.

## 2019-08-12 ENCOUNTER — Ambulatory Visit: Payer: Medicare Other | Attending: Internal Medicine

## 2019-08-12 DIAGNOSIS — Z23 Encounter for immunization: Secondary | ICD-10-CM | POA: Insufficient documentation

## 2019-08-12 NOTE — Progress Notes (Signed)
   Covid-19 Vaccination Clinic  Name:  KAMEO BAINS    MRN: [id number] DOB: Sep 21, 1950  08/12/2019  Ms. Leatherbury was observed post Covid-19 immunization for 15 minutes without incident. She was provided with Vaccine Information Sheet and instruction to access the V-Safe system.   Ms. Launer was instructed to call 911 with any severe reactions post vaccine: Marland Kitchen Difficulty breathing  . Swelling of face and throat  . A fast heartbeat  . A bad rash all over body  . Dizziness and weakness   Immunizations Administered    Name Date Dose VIS Date Route   Pfizer COVID-19 Vaccine 08/12/2019  3:20 PM 0.3 mL 05/23/2019 Intramuscular   Manufacturer: [employer], [employer]   Lot: [id number]   NDC: [address]-9

## 2019-08-13 ENCOUNTER — Ambulatory Visit: Payer: Medicare Other | Admitting: Nurse Practitioner

## 2019-08-13 ENCOUNTER — Ambulatory Visit: Payer: Medicare Other
